# Patient Record
Sex: Female | Born: 1950 | Race: White | Hispanic: No | Marital: Married | Smoking: Former smoker
Health system: Southern US, Community
[De-identification: ages and names within clinical notes are randomized; demographics above are authoritative.]

## PROBLEM LIST (undated history)

## (undated) DIAGNOSIS — C801 Malignant (primary) neoplasm, unspecified: Secondary | ICD-10-CM

## (undated) DIAGNOSIS — M1990 Unspecified osteoarthritis, unspecified site: Secondary | ICD-10-CM

## (undated) HISTORY — DX: Malignant (primary) neoplasm, unspecified: C80.1

## (undated) HISTORY — PX: KNEE SURGERY: SHX244

## (undated) HISTORY — PX: MOLE REMOVAL: SHX2046

## (undated) HISTORY — DX: Unspecified osteoarthritis, unspecified site: M19.90

---

## 2007-09-07 ENCOUNTER — Ambulatory Visit (HOSPITAL_COMMUNITY): Admission: RE | Admit: 2007-09-07 | Discharge: 2007-09-07 | Payer: Self-pay | Admitting: General Surgery

## 2007-09-22 ENCOUNTER — Encounter: Admission: RE | Admit: 2007-09-22 | Discharge: 2007-09-22 | Payer: Self-pay | Admitting: General Surgery

## 2008-04-28 ENCOUNTER — Encounter: Admission: RE | Admit: 2008-04-28 | Discharge: 2008-07-12 | Payer: Self-pay | Admitting: General Surgery

## 2008-05-16 ENCOUNTER — Ambulatory Visit (HOSPITAL_COMMUNITY): Admission: RE | Admit: 2008-05-16 | Discharge: 2008-05-17 | Payer: Self-pay | Admitting: General Surgery

## 2009-06-28 ENCOUNTER — Encounter: Admission: RE | Admit: 2009-06-28 | Discharge: 2009-06-28 | Payer: Self-pay | Admitting: Surgery

## 2010-06-15 IMAGING — RF DG ESOPHAGUS
12 of 13 series · 20 of 24 positions shown · non-contrast
Comparison: None

CLINICAL DATA: Dysphagia, lap band

ESOPHOGRAM / BARIUM SWALLOW
TECHNIQUE: Single contrast examination was performed using thin
barium.
Fluoroscopy time:  1.6 minutes.

[Series 1: run · 1 of 1 slices shown (1 of 12)]
[im 1/1]
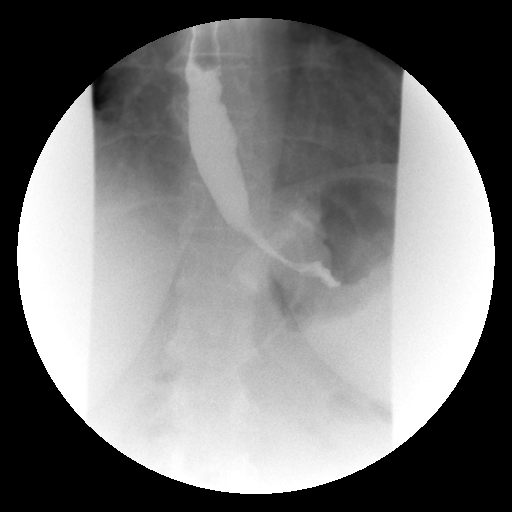

[Series 2: run · 1 of 1 slices shown (2 of 12)]
[im 1/1]
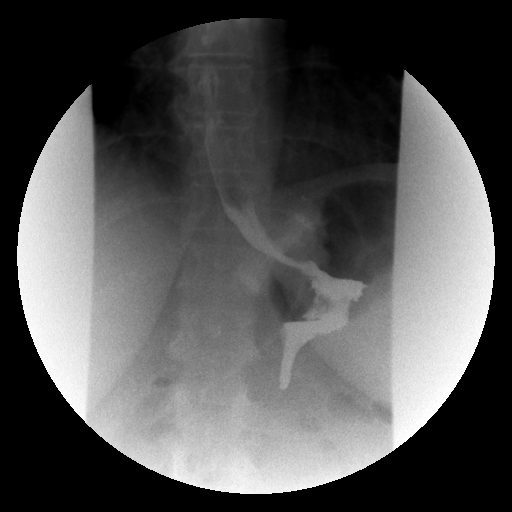

[Series 3: run · 2 of 6 slices shown (3 of 12)]
[im 3/6]
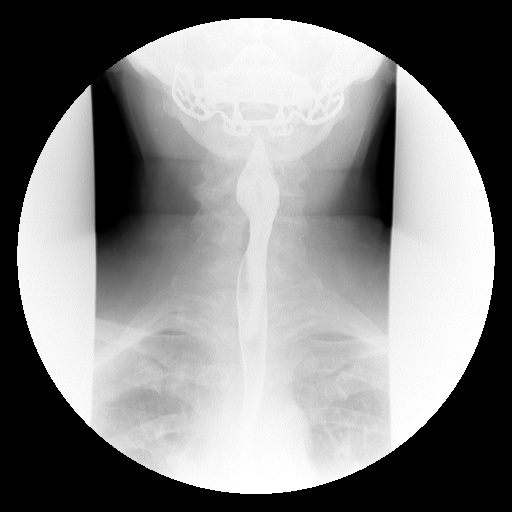
[im 6/6]
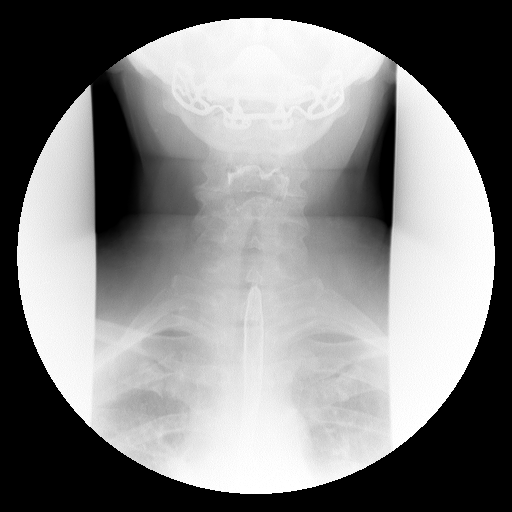

[Series 4: run · 3 of 6 slices shown (4 of 12)]
[im 1/6]
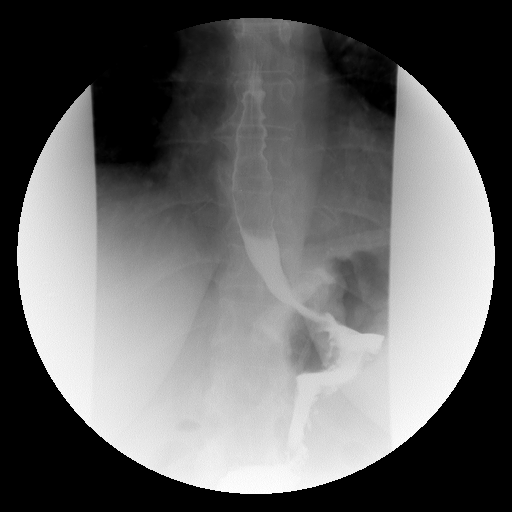
[im 3/6]
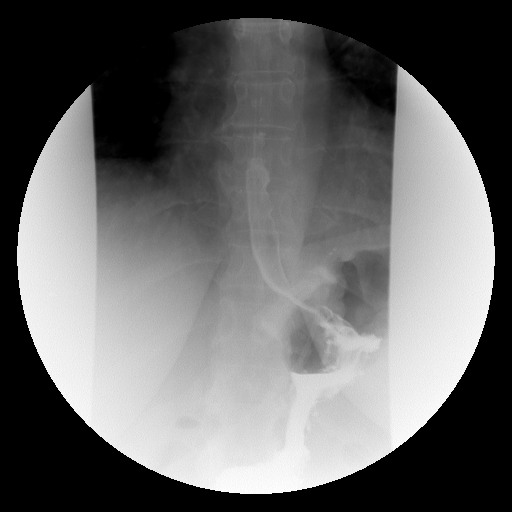
[im 6/6]
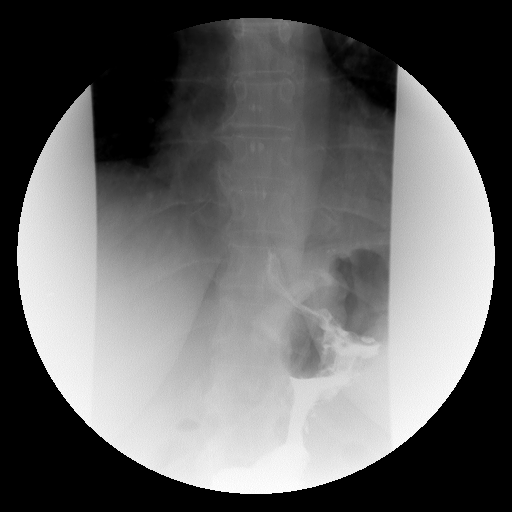

[Series 5: run · 2 of 6 slices shown (5 of 12)]
[im 3/6]
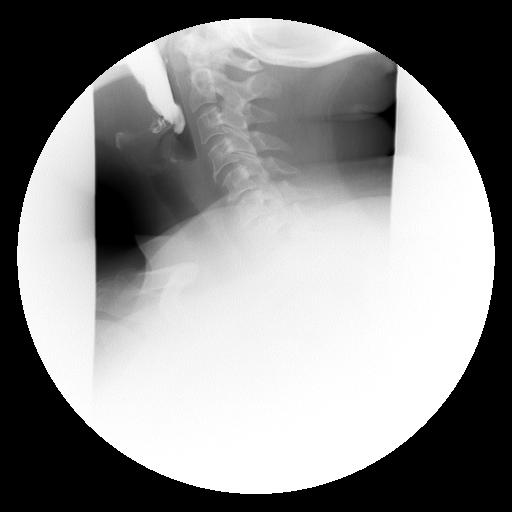
[im 6/6]
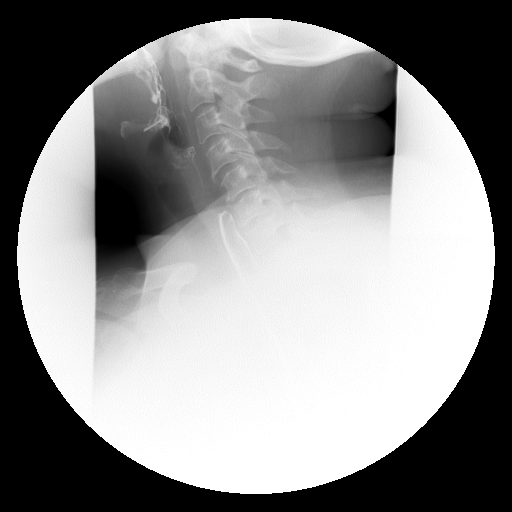

[Series 6: run · 1 of 1 slices shown (6 of 12)]
[im 1/1]
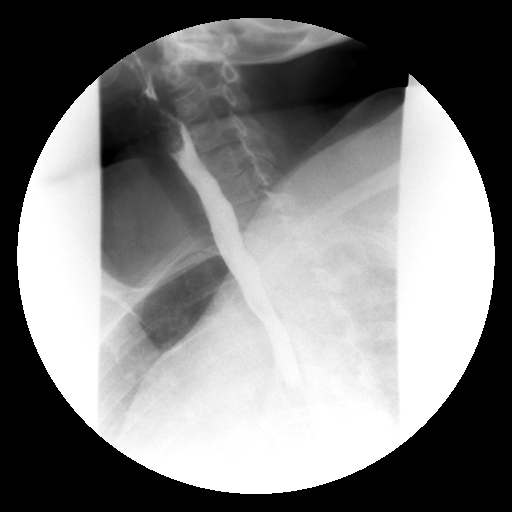

[Series 7: run · 1 of 1 slices shown (7 of 12)]
[im 1/1]
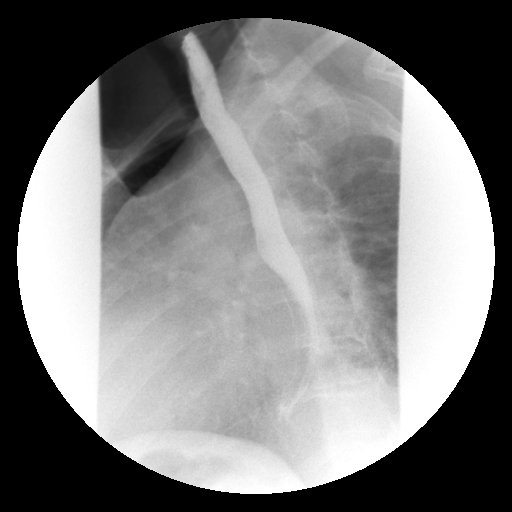

[Series 8: run · 1 of 1 slices shown (8 of 12)]
[im 1/1]
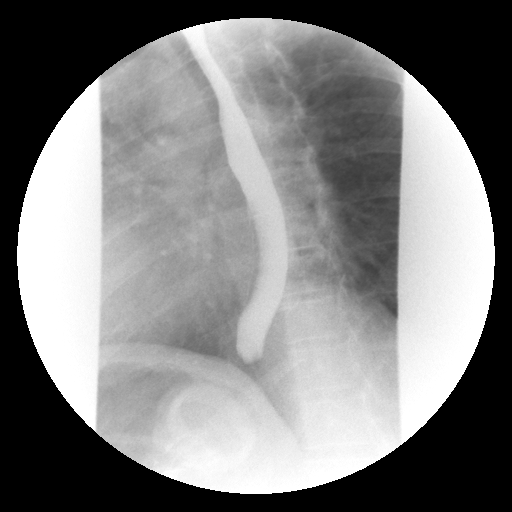

[Series 11: run · 1 of 1 slices shown (9 of 12)]
[im 1/1]
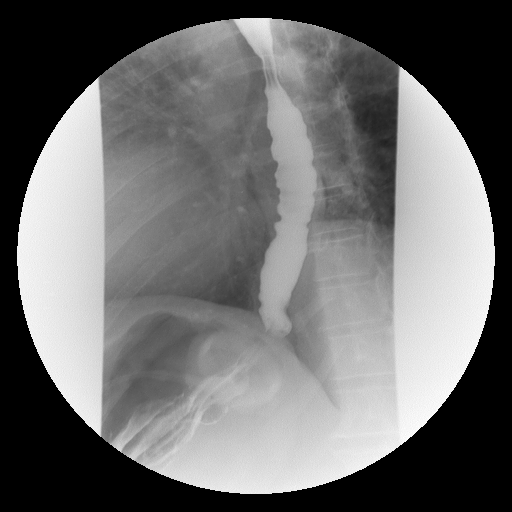

[Series 12: run · 1 of 1 slices shown (10 of 12)]
[im 1/1]
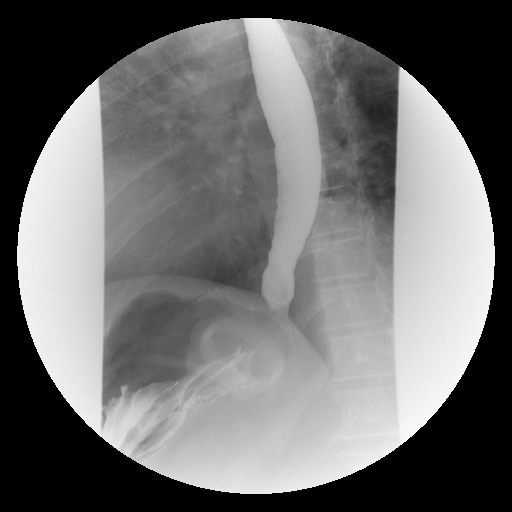

[Series 13: run · 5 of 12 slices shown (11 of 12)]
[im 1/12]
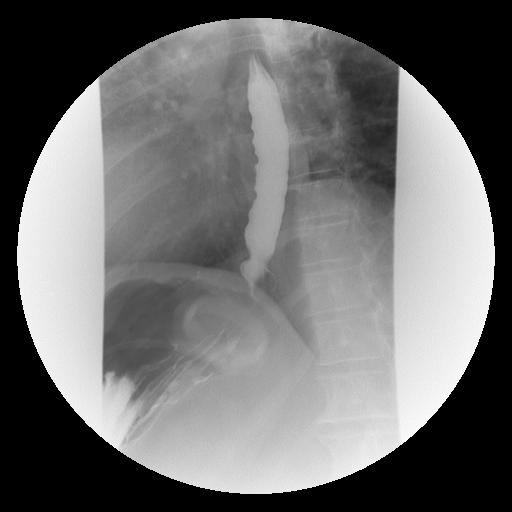
[im 3/12]
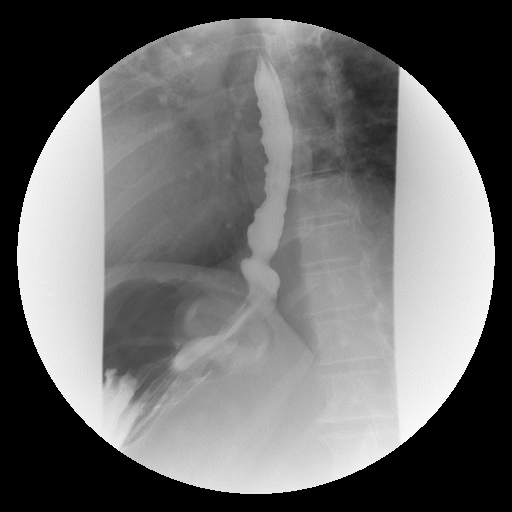
[im 5/12]
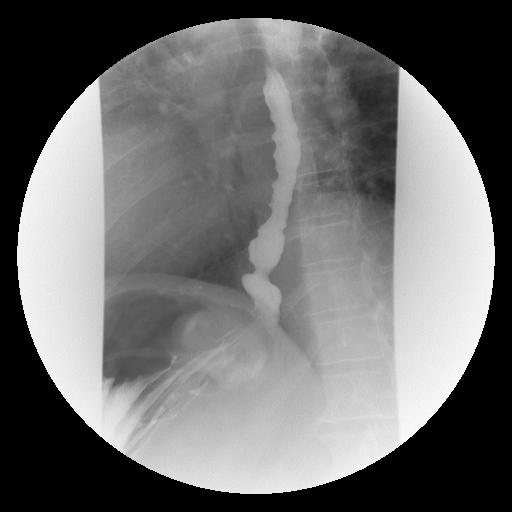
[im 9/12]
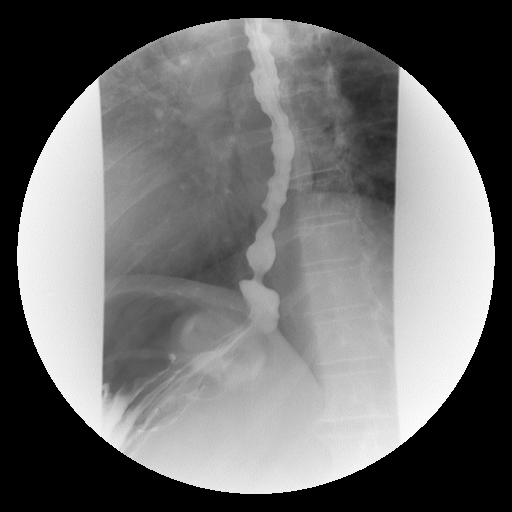
[im 12/12]
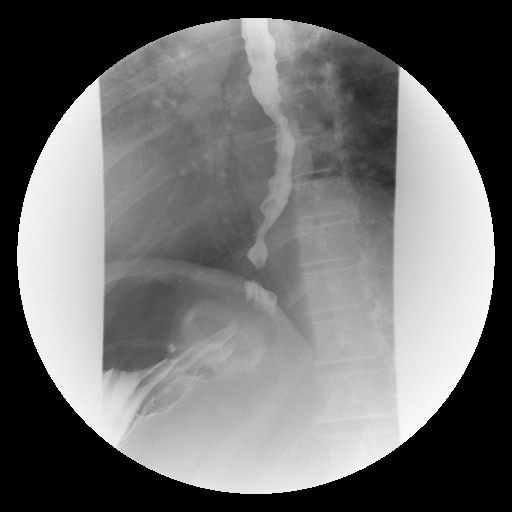

[Series 14: run · 1 of 1 slices shown (12 of 12)]
[im 1/1]
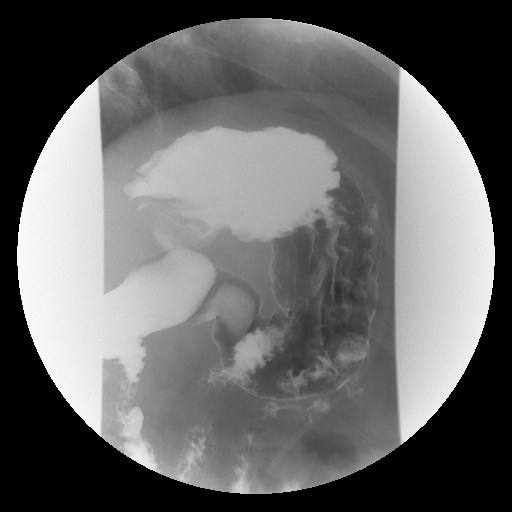

[20 of 24 positions shown; findings below may reference images not displayed]

FINDINGS: A single contrast barium swallow was performed.  In the
erect position barium passes freely through the lap band with no
evidence of obstruction.  There are moderate tertiary contractions
of the distal esophagus noted.  The swallowing mechanism appears
normal.  No reflux is seen.
IMPRESSION: Moderate tertiary contractions in the distal esophagus.  There is
no obstruction to flow of barium through the lap band, and no
reflux is seen.

## 2011-03-05 NOTE — Op Note (Signed)
NAMELORELY, BUBB               ACCOUNT NO.:  [id number]   MEDICAL RECORD NO.:  [id number]          PATIENT TYPE:  AMB   LOCATION:  DAY                          FACILITY:  [facility]   PHYSICIAN:  Thornton Park. Daphine Deutscher, MD  DATE OF BIRTH:  31-Oct-1950   DATE OF PROCEDURE:  05/16/2008  DATE OF DISCHARGE:                               OPERATIVE REPORT   PREOPERATIVE INDICATIONS:  Morbid obesity, body mass index of 46.6, with  multiple comorbidities.   PROCEDURE:  Laparoscopic adjustable gastric banding (Allergan- APL  system).   SURGEON:  Thornton Park. Daphine Deutscher, M.D.   ASSISTANT:  Sandria Bales. Ezzard Standing, M.D.   ANESTHESIA:  General endotracheal.   DESCRIPTION OF PROCEDURE:  Bridget Robinson is a 60 year old white female  from [facility] who was initially seen and evaluated by Dr. Johna Sheriff.  He was unable to be available to perform her surgery today, and I saw  her preoperatively and explained some of the things that we were doing  and reacquainted her with the gastric band.  Aware of her upper GI  findings of a possible small sliding hiatal hernia, I discussed possible  repair of that with her, and told her we would looked for that.  She was  taken to room #1 at [facility] on May 16, 2008 and given general  anesthesia.  The abdomen was prepped with Techni-Care and draped  sterilely.  Access to the abdomen was gained using an OptiVu 0-degree 11  mm port to the left upper quadrant without difficulty, and standard band  placement trocars were used, including a 15 in the slightly upper right  midline, 12 below that, 5 Laterally, and 5 in the upper midline for the  [facility] retractor.  With the [facility] in place, first we put in the  sizing balloon and blew it up to 15 mL, and pulled it back to the GE  junction, and there was no evidence of a hiatal hernia with that.  We  did not see a dimpling or any evidence of a hernia, so elected not to do  anything in terms of posterior repair.  Went ahead and  created a tract  using the pars flaccida technique, going along the margin of the right  crus, and then dissecting this up along the left crus.  An APL band was  chosen and put into the abdomen and brought around and secured.  Plication was then performed using interrupted Surgideks, and held in  place with tie knots.  The ports were all removed, as was the [facility]  retractor, without difficulty, and then was connected to the port which  was then placed lower to the right of midline and sutured to the fascia  with 4 sutures of 2-0 Prolene.  A little bit of saline was left in the  band.  The wounds were irrigated and injected with Marcaine and closed 4-  0 Vicryl and Benzoin Steri-Strips.  The patient was taken to the  recovery room in satisfactory addition.      Thornton Park Daphine Deutscher, MD  Electronically Signed    MBM/MEDQ  D:  05/16/2008  T:  05/16/2008  Job:  [id number]   cc:   Josiah Lobo  Fax: [phone]

## 2011-07-19 LAB — DIFFERENTIAL
Basophils Absolute: 0
Basophils Relative: 0
Lymphocytes Relative: 11 — ABNORMAL LOW
Lymphs Abs: 0.8
Monocytes Absolute: 0.4
Neutrophils Relative %: 83 — ABNORMAL HIGH

## 2011-07-19 LAB — CBC
HCT: 36.9
Platelets: 251
RDW: 13.2

## 2011-12-10 ENCOUNTER — Telehealth (INDEPENDENT_AMBULATORY_CARE_PROVIDER_SITE_OTHER): Payer: Self-pay | Admitting: Surgery

## 2011-12-10 NOTE — Telephone Encounter (Signed)
12/10/11 mailed recall letter for bariatric surgery f/u to pt. Adv pt to call [facility] @ [phone] to schedule an appointment...cef °

## 2012-02-14 ENCOUNTER — Encounter (INDEPENDENT_AMBULATORY_CARE_PROVIDER_SITE_OTHER): Payer: Self-pay | Admitting: Surgery

## 2012-02-14 ENCOUNTER — Ambulatory Visit (INDEPENDENT_AMBULATORY_CARE_PROVIDER_SITE_OTHER): Payer: BC Managed Care – PPO | Admitting: Surgery

## 2012-02-14 VITALS — BP 122/88 | HR 60 | Temp 97.6°F | Resp 18 | Ht 65.5 in | Wt 281.8 lb

## 2012-02-14 DIAGNOSIS — M2559 Pain in other specified joint: Secondary | ICD-10-CM

## 2012-02-14 DIAGNOSIS — Z9884 Bariatric surgery status: Secondary | ICD-10-CM | POA: Insufficient documentation

## 2012-02-14 DIAGNOSIS — Z4651 Encounter for fitting and adjustment of gastric lap band: Secondary | ICD-10-CM

## 2012-02-14 NOTE — Progress Notes (Signed)
Bridget Robinson came in today to discuss removing her lap band. She has not seen me since February of 2011 and her band was put and back in July of 09. The last time filter I added 1.5 cc tube and because I take down some for reflux. It is apparent from her symptoms which is probably been a little too tight and has been developing maladaptive eating. Her weight today is 281.8. She also had a lot of arthritic problems her back including injections and is followed by Dr. Cyndia Diver and Dr. Ala Dach on how point. She has had arthritic back pain. 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 She said that the dermatologist wanted to send her to a geneticist in [place]. This had something to do with her sister with pancreatic cancer. I don't have any basis to support center referral.  Because of her back pain she's been told that they were unable to treat her with any of the NSAIDs with her lap band.  Today I accessed her port and removed 0.75 cc from her band. I think we should try to give her a chance to restrict her eating and take the medicine she needs for back pain. See her back in 6 weeks and see how she is doing. If not she would like to have her been removed by the end of June which is when her deductable runs out

## 2012-02-14 NOTE — Patient Instructions (Signed)
Diet: Try staying on a very low carbohydrate diet. For breakfast try eating boiled eggs; use a protein shake in the morning if you need a snack.  Cheese sticks are good too.  Salads with cheese and meat are good but watch the salad dressing.  Drink water and unsweetened beverages such as tea.  Steamed broccoli is a good vegetable as well as spinach and greens.   You may take aspirin and Ibuprofen if needed for your back.

## 2012-04-10 ENCOUNTER — Encounter (INDEPENDENT_AMBULATORY_CARE_PROVIDER_SITE_OTHER): Payer: BC Managed Care – PPO | Admitting: Surgery

## 2012-04-17 ENCOUNTER — Encounter (INDEPENDENT_AMBULATORY_CARE_PROVIDER_SITE_OTHER): Payer: BC Managed Care – PPO | Admitting: Surgery

## 2012-06-17 ENCOUNTER — Encounter (INDEPENDENT_AMBULATORY_CARE_PROVIDER_SITE_OTHER): Payer: BC Managed Care – PPO | Admitting: Surgery

## 2012-07-22 ENCOUNTER — Encounter (INDEPENDENT_AMBULATORY_CARE_PROVIDER_SITE_OTHER): Payer: BC Managed Care – PPO | Admitting: Surgery

## 2012-08-17 ENCOUNTER — Encounter (INDEPENDENT_AMBULATORY_CARE_PROVIDER_SITE_OTHER): Payer: Self-pay | Admitting: Surgery

## 2012-08-17 ENCOUNTER — Ambulatory Visit (INDEPENDENT_AMBULATORY_CARE_PROVIDER_SITE_OTHER): Payer: BC Managed Care – PPO | Admitting: Surgery

## 2012-08-17 VITALS — BP 122/80 | HR 64 | Temp 97.7°F | Resp 20 | Ht 65.5 in | Wt 285.6 lb

## 2012-08-17 DIAGNOSIS — M1990 Unspecified osteoarthritis, unspecified site: Secondary | ICD-10-CM | POA: Insufficient documentation

## 2012-08-17 DIAGNOSIS — M129 Arthropathy, unspecified: Secondary | ICD-10-CM

## 2012-08-17 DIAGNOSIS — Z9884 Bariatric surgery status: Secondary | ICD-10-CM

## 2012-08-17 NOTE — Patient Instructions (Signed)
Thanks for your patience.  If you need further assistance after leaving the office, please call our office and speak with a [facility] nurse.  [phone].  If you want to leave a message for Dr. Joel Cowin, please call his office phone at [phone]. 

## 2012-08-17 NOTE — Progress Notes (Signed)
Chief Complaint:  Weight loss failure with lapband  History of Present Illness:  Bridget Robinson is an 61 y.o. female who presents today to discuss having her lapband removed.  We had this discussion before and I tried to work with her on this but she is unable to achieve more than a maximum 36 lb weight loss.  Currently she has lost a net 10 lbs.  She wants to have her band removed and would like to convert to a Roux en Y gastric bypass.  She is aware of the risks of this procedure at her age.  We also discussed sleeve gastrectomy but either way we will need to try to get her approved from removal of lapband and conversion to Roux Y gastric bypass or sleeve gastrectomy.    Past Medical History  Diagnosis Date  . Arthritis   . Asthma   . Cancer     skin  . Osteoarthritis     Past Surgical History  Procedure Date  . Mole removal     malignant  . Knee surgery     Current Outpatient Prescriptions  Medication Sig Dispense Refill  . ADVAIR DISKUS 500-50 MCG/DOSE AEPB Ad lib.      . fish oil-omega-3 fatty acids 1000 MG capsule Take 2 g by mouth daily.      Marland Kitchen oxyCODONE-acetaminophen (PERCOCET) 7.5-325 MG per tablet Ad lib.      Marland Kitchen PROAIR HFA 108 (90 BASE) MCG/ACT inhaler Ad lib.       Sulfa drugs cross reactors Family History  Problem Relation Age of Onset  . Cancer Mother     skin  . Heart disease Paternal Aunt   . Heart disease Paternal Uncle   . Heart disease Paternal Grandmother   . Heart disease Paternal Grandfather    Social History:   reports that she has quit smoking. She does not have any smokeless tobacco history on file. She reports that she does not drink alcohol or use illicit drugs.   REVIEW OF SYSTEMS - PERTINENT POSITIVES ONLY: Arthritis is her main comorbid condition  Physical Exam:   Blood pressure 122/80, pulse 64, temperature 97.7 F (36.5 C), temperature source Temporal, resp. rate 20, height 5' 5.5" (1.664 m), weight 285 lb 9.6 oz (129.547 kg). Body mass  index is 46.80 kg/(m^2).  Gen:  WDWN WF NAD  Neurological: Alert and oriented to person, place, and time. Motor and sensory function is grossly intact  Head: Normocephalic and atraumatic.  Eyes: Conjunctivae are normal. Pupils are equal, round, and reactive to light. No scleral icterus.  Neck: Normal range of motion. Neck supple. No tracheal deviation or thyromegaly present.  Cardiovascular:  SR without murmurs or gallops.  No carotid bruits Respiratory: Effort normal.  No respiratory distress. No chest wall tenderness. Breath sounds normal.  No wheezes, rales or rhonchi.  Abdomen:  Obese, nontender GU: Musculoskeletal: complains of pain in her knees. No cyanosis, edema or clubbing noted Lymphadenopathy: No cervical, preauricular, postauricular or axillary adenopathy is present Skin: Skin is warm and dry. No rash noted. No diaphoresis. No erythema. No pallor. Pscyh: Normal mood and affect. Behavior is normal. Judgment and thought content normal.   LABORATORY RESULTS: No results found for this or any previous visit (from the past 48 hour(s)).  RADIOLOGY RESULTS: No results found.  Problem List: Patient Active Problem List  Diagnosis  . Lapband APL July 2009    Assessment & Plan: Morbid obesity BMI 46 despite lapband APL in 2009.  Desires  removal and conversion to alternate weight loss surgery.      Matt B. Daphine Deutscher, MD, [facility] [phone] beeper [phone]  08/17/2012 9:30 AM

## 2012-09-08 ENCOUNTER — Telehealth (INDEPENDENT_AMBULATORY_CARE_PROVIDER_SITE_OTHER): Payer: Self-pay | Admitting: General Surgery

## 2012-09-08 NOTE — Addendum Note (Signed)
Addended byLittie Deeds on: 09/08/2012 10:02 AM   Modules accepted: Orders

## 2012-09-08 NOTE — Telephone Encounter (Signed)
Spoke with pt and informed her that her insurance is making [place] submit her like a new bariatric which in turn means that she is going to have to go through all the tests again. I informed her I have put in the orders for all of these tests and that I am sending them around to [address] [facility] for her to coordinate these with the patient.  She said this would be fine.

## 2015-02-15 ENCOUNTER — Telehealth (HOSPITAL_COMMUNITY): Payer: Self-pay

## 2015-02-15 NOTE — Telephone Encounter (Signed)
This patient is overdue for recommended follow-up with a bariatric surgeon at [facility]. Call attempted today to reestablish post-op care with [facility], but unable to reach patient by phone.  A letter will be mailed to the patient today to the address on file from [facility] advising the patient on the benefits of follow-up care and directing them to call [facility] at [phone] to schedule an appointment at their earliest convenience.  ° °Amanda T. Fleming °Bariatric Office Coordinator °[phone] ° °

## 2016-08-29 ENCOUNTER — Encounter (HOSPITAL_COMMUNITY): Payer: Self-pay

## 2017-12-16 ENCOUNTER — Encounter (HOSPITAL_COMMUNITY): Payer: Self-pay
